# Patient Record
Sex: Female | Born: 1985 | Race: White | Hispanic: No | Marital: Single | Smoking: Current every day smoker
Health system: Southern US, Community
[De-identification: ages and names within clinical notes are randomized; demographics above are authoritative.]

## PROBLEM LIST (undated history)

## (undated) DIAGNOSIS — F32A Depression, unspecified: Secondary | ICD-10-CM

## (undated) DIAGNOSIS — F329 Major depressive disorder, single episode, unspecified: Secondary | ICD-10-CM

## (undated) DIAGNOSIS — R569 Unspecified convulsions: Secondary | ICD-10-CM

## (undated) DIAGNOSIS — F909 Attention-deficit hyperactivity disorder, unspecified type: Secondary | ICD-10-CM

---

## 2009-10-08 ENCOUNTER — Emergency Department: Payer: Self-pay | Admitting: Emergency Medicine

## 2011-04-07 ENCOUNTER — Inpatient Hospital Stay: Payer: Self-pay | Admitting: Obstetrics and Gynecology

## 2011-04-11 LAB — PATHOLOGY REPORT

## 2012-10-31 LAB — URINALYSIS, COMPLETE
Bilirubin,UR: NEGATIVE
Blood: NEGATIVE
Glucose,UR: NEGATIVE mg/dL (ref 0–75)
Ph: 6 (ref 4.5–8.0)
Specific Gravity: 1.012 (ref 1.003–1.030)
Squamous Epithelial: 4

## 2012-10-31 LAB — TSH: Thyroid Stimulating Horm: 2.4 u[IU]/mL

## 2012-10-31 LAB — COMPREHENSIVE METABOLIC PANEL
Albumin: 4.2 g/dL (ref 3.4–5.0)
Anion Gap: 8 (ref 7–16)
Bilirubin,Total: 0.4 mg/dL (ref 0.2–1.0)
Chloride: 111 mmol/L — ABNORMAL HIGH (ref 98–107)
Co2: 23 mmol/L (ref 21–32)
Glucose: 90 mg/dL (ref 65–99)
Osmolality: 281 (ref 275–301)
SGPT (ALT): 30 U/L (ref 12–78)
Sodium: 142 mmol/L (ref 136–145)
Total Protein: 8.3 g/dL — ABNORMAL HIGH (ref 6.4–8.2)

## 2012-10-31 LAB — DRUG SCREEN, URINE
Amphetamines, Ur Screen: POSITIVE (ref ?–1000)
Cannabinoid 50 Ng, Ur ~~LOC~~: NEGATIVE (ref ?–50)
Cocaine Metabolite,Ur ~~LOC~~: NEGATIVE (ref ?–300)

## 2012-10-31 LAB — ETHANOL
Ethanol %: <0.003 % (ref 0.000–0.080)
Ethanol: <3 mg/dL

## 2012-10-31 LAB — CBC
HGB: 15.3 g/dL (ref 12.0–16.0)
MCH: 34 pg (ref 26.0–34.0)
MCV: 99 fL (ref 80–100)
RBC: 4.51 10*6/uL (ref 3.80–5.20)

## 2012-10-31 LAB — SALICYLATE LEVEL: Salicylates, Serum: 5.5 mg/dL — ABNORMAL HIGH

## 2012-10-31 LAB — ACETAMINOPHEN LEVEL: Acetaminophen: <2 ug/mL

## 2012-11-01 ENCOUNTER — Inpatient Hospital Stay: Payer: Self-pay | Admitting: Psychiatry

## 2012-11-02 LAB — BEHAVIORAL MEDICINE 1 PANEL
Alkaline Phosphatase: 46 U/L — ABNORMAL LOW (ref 50–136)
Anion Gap: 5 — ABNORMAL LOW (ref 7–16)
BUN: 10 mg/dL (ref 7–18)
Basophil #: 0 10*3/uL (ref 0.0–0.1)
Bilirubin,Total: 0.3 mg/dL (ref 0.2–1.0)
Creatinine: 0.69 mg/dL (ref 0.60–1.30)
EGFR (African American): >60
EGFR (Non-African Amer.): >60
Eosinophil #: 0.1 10*3/uL (ref 0.0–0.7)
Eosinophil %: 1.5 %
HGB: 12.8 g/dL (ref 12.0–16.0)
Lymphocyte #: 3.7 10*3/uL — ABNORMAL HIGH (ref 1.0–3.6)
MCH: 34 pg (ref 26.0–34.0)
MCHC: 34.9 g/dL (ref 32.0–36.0)
MCV: 97 fL (ref 80–100)
Monocyte %: 7.6 %
Neutrophil #: 4.2 10*3/uL (ref 1.4–6.5)
Neutrophil %: 48.3 %
Osmolality: 283 (ref 275–301)
Platelet: 250 10*3/uL (ref 150–440)
SGOT(AST): 23 U/L (ref 15–37)
Sodium: 143 mmol/L (ref 136–145)
Thyroid Stimulating Horm: 1.85 u[IU]/mL
Total Protein: 6.7 g/dL (ref 6.4–8.2)

## 2013-08-21 ENCOUNTER — Emergency Department: Payer: Self-pay | Admitting: Emergency Medicine

## 2013-08-21 LAB — URINALYSIS, COMPLETE
BILIRUBIN, UR: NEGATIVE
Bacteria: NONE SEEN
Blood: NEGATIVE
Glucose,UR: NEGATIVE mg/dL (ref 0–75)
LEUKOCYTE ESTERASE: NEGATIVE
NITRITE: NEGATIVE
PH: 6 (ref 4.5–8.0)
Protein: NEGATIVE
RBC,UR: 2 /HPF (ref 0–5)
SPECIFIC GRAVITY: 1.021 (ref 1.003–1.030)
WBC UR: 1 /HPF (ref 0–5)

## 2013-08-21 LAB — ACETAMINOPHEN LEVEL: Acetaminophen: <2 ug/mL

## 2013-08-21 LAB — CBC
HCT: 38.4 % (ref 35.0–47.0)
HGB: 12.6 g/dL (ref 12.0–16.0)
MCH: 32.4 pg (ref 26.0–34.0)
MCHC: 32.8 g/dL (ref 32.0–36.0)
MCV: 99 fL (ref 80–100)
Platelet: 393 10*3/uL (ref 150–440)
RBC: 3.89 10*6/uL (ref 3.80–5.20)
RDW: 13.5 % (ref 11.5–14.5)
WBC: 7.6 10*3/uL (ref 3.6–11.0)

## 2013-08-21 LAB — PREGNANCY, URINE: PREGNANCY TEST, URINE: NEGATIVE m[IU]/mL

## 2013-08-21 LAB — BASIC METABOLIC PANEL
ANION GAP: 6 — AB (ref 7–16)
BUN: 8 mg/dL (ref 7–18)
CHLORIDE: 110 mmol/L — AB (ref 98–107)
CREATININE: 0.92 mg/dL (ref 0.60–1.30)
Calcium, Total: 8.6 mg/dL (ref 8.5–10.1)
Co2: 26 mmol/L (ref 21–32)
Glucose: 100 mg/dL — ABNORMAL HIGH (ref 65–99)
OSMOLALITY: 282 (ref 275–301)
POTASSIUM: 3.3 mmol/L — AB (ref 3.5–5.1)
SODIUM: 142 mmol/L (ref 136–145)

## 2013-08-21 LAB — DRUG SCREEN, URINE
AMPHETAMINES, UR SCREEN: POSITIVE (ref ?–1000)
BARBITURATES, UR SCREEN: NEGATIVE (ref ?–200)
Benzodiazepine, Ur Scrn: POSITIVE (ref ?–200)
Cannabinoid 50 Ng, Ur ~~LOC~~: NEGATIVE (ref ?–50)
Cocaine Metabolite,Ur ~~LOC~~: NEGATIVE (ref ?–300)
MDMA (Ecstasy)Ur Screen: NEGATIVE (ref ?–500)
Methadone, Ur Screen: NEGATIVE (ref ?–300)
Opiate, Ur Screen: NEGATIVE (ref ?–300)
Phencyclidine (PCP) Ur S: NEGATIVE (ref ?–25)
Tricyclic, Ur Screen: NEGATIVE (ref ?–1000)

## 2013-08-21 LAB — SALICYLATE LEVEL: SALICYLATES, SERUM: 5.2 mg/dL — AB

## 2013-08-21 LAB — ETHANOL
Ethanol %: <0.003 % (ref 0.000–0.080)
Ethanol: <3 mg/dL

## 2013-08-21 LAB — TSH: Thyroid Stimulating Horm: 1.11 u[IU]/mL

## 2014-09-01 NOTE — Consult Note (Signed)
PATIENT NAME:  Wanda Zavala, Wanda Zavala MR#:  [id number] DATE OF BIRTH:  1985-10-12  DATE OF CONSULTATION:  11/01/2012  CONSULTING PHYSICIAN:  Izola PriceFrances C. Jaclynn MajorGreason, MD  CHIEF COMPLAINT:  "I don't know why I have to be here."  SUMMARY:  Miss Wanda Zavala is a 29 year old female who is brought to the ED after a seizure at home. Dr. Mayford KnifeWilliams placed her under IVC at the Emergency Department. Evidently, Wanda Zavala has been over-taking her Adderall in an effort to stay awake. She reports she is afraid of her mother-in-law, and does not want to sleep with her while she is in the house. She then goes on to say that she really does not think she is afraid of anyone, and she "might to be on" an antidepressant. She was on an antidepressant in the past, but self-discontinued it. She has an 5441-month-old child, and she starts ruminating about postpartum depression. She then goes on to deny that she was suicidal. Then she went on to say that she is safe at home. She sees Dr. Omelia Zavala.   HISTORY OF PRESENT ILLNESS:  Wanda Zavala reports that she takes her medicine as prescribed, with the exception of trying to stay awake. She is currently denying any substance abuse, although her urine drug screen is positive for amphetamines and benzodiazepines.  On interview, Wanda Zavala is somewhat sad-appearing and disheveled. She has soft speech and looks rather blunted. She is alert and attentive. She is vague. Previous hospitalizations, she is also vague about that. History of suicide attempt. She has current outpatient treatment. She currently is taking out patient medications prescribed by Dr. [place] in [place] They are Adderall 5 mg extended release p.o. q. morning, Xanax 1 mg p.o. q.i.d., and she also takes Soma 350 mg p.o. q day.   ALLERGIES:  No known drug allergies.  SOCIAL HISTORY:  She denies any drug history.  PAST MEDICAL/SURGICAL HISTORY:  She denies mental illness in the family.  SOCIAL HISTORY:  She lives at home and has an  7241-month old child. She denies any history of physical or sexual abuse. She denies any history of arrests or previous incarcerations. Speech is soft and slightly hard to understand, as there is a little slurring. Mood is flat and sad. She was groggy or interview. Her affect was slightly depressed. Her thought processes were linear and goal-oriented. She was oriented x 4. Her attention was within normal limits. Her concentration was fair. Her memory was impaired, but that could be because of her grogginess. Fund of knowledge was fair. Language fair. Judgment is poor, with minimal insight. Her reliability was fair. Suicide risk level, I would say some current risk.  REVIEW OF SYSTEMS:  Noncontributory.  MEDICATIONS:  Adderall 5 mg extended release p.o. q. morning, Xanax 1 mg p.o. q.i.d., Soma 350 mg p.o. q. day.   ALLERGIES:  No known drug allergies.  AXIS I:  Depressive disorder NOS. AXIS II:  Nothing that I know of.  TREATMENT PLAN:  Ad[facility]. Medications, do not prescribe her Xanax.       ____________________________ Izola PriceFrances C. Jaclynn MajorGreason, MD fcg:mr D: 11/01/2012 16:55:00 ET T: 11/01/2012 20:22:53 ET JOB#: [id number]  cc: Izola PriceFrances C. Jaclynn MajorGreason, MD, <Dictator> Maryan PulsFRANCES C Yi Haugan MD ELECTRONICALLY SIGNED 11/11/2012 12:09

## 2014-09-01 NOTE — Discharge Summary (Signed)
PATIENT NAME:  Wanda RichardMARTIN, Ahlia MR#:  [id number] DATE OF BIRTH:  06/27/1985  DATE OF ADMISSION:  11/01/2012 DATE OF DISCHARGE:  11/03/2012  HOSPITAL COURSE: See dictated history and physical for details of admission. This 29 year old woman presented to the Emergency Room having had a seizure. Apparently, there was concern that this was somehow related to her prescription medication which appears to have been the justification for hospitalization on the psychiatry unit. On the unit, the patient has denied suicidal ideation, and has not expressed symptoms of depression, and has not shown any psychosis or dangerous behavior. She was continued on benzodiazepines, ultimately at the same dose that she was taking outside the hospital. I have attempted to get a neurology consult without success as we appear to be without neurologic services at this time. It is possible that her seizure may have been related to a recent decrease in her use of alprazolam, possibly by running short of it. The patient has been educated about the proper use of benzodiazepines, particularly not overusing it, not making major shifts from high doses to low doses. She has been educated about the importance of following up with her primary care doctor about her potential seizure disorder. She is encouraged to avoid all substance abuse. There does not appear to be clear indication for initiation of other medication at this point. No indication of acute dangerousness. The patient is to be discharged home and will follow up with Dr. Omelia BlackwaterHeaden in the community.   DISCHARGE MEDICATIONS: Alprazolam 1 mg q. 6 hours, 10 days given.   LABORATORY RESULTS: Admission labs from the 22nd showed a drug screen positive for amphetamines and benzodiazepines, which is consistent with her prescribed medicine. TSH normal at 2.4. Alcohol undetected. Total protein slightly elevated at 8.3, chloride slightly elevated 111, no significance. Hematology panel all normal.  Liver function tests minimal abnormalities. No significance.   MENTAL STATUS EXAM AT DISCHARGE: Neatly dressed and groomed woman, looks her stated age, cooperative with the interview. Good eye contact, normal psychomotor activity. Speech normal in rate, tone and volume. Affect is euthymic, reactive, appropriate. Mood is stated as good. Thoughts appear lucid with no evidence of loosening of associations or delusions. Denies auditory or visual hallucinations. Denies suicidal or homicidal ideation. Shows improved judgment and insight. Normal intelligence.   DISPOSITION: Discharge home. Follow up with her usual outpatient provider.  DIAGNOSIS, PRINCIPAL AND PRIMARY:  AXIS I: Panic disorder.   SECONDARY DIAGNOSES: AXIS I: No diagnosis.   AXIS II: No diagnosis.   AXIS III: New onset seizure.   AXIS IV: Severe from taking care of 3 young children, recent move into difficult circumstances, financial problems, lack of access to care.   AXIS V: Functioning at time of evaluation and discharge 60.   ____________________________ Audery AmelJohn T. Clapacs, MD jtc:cb D: 11/03/2012 17:35:45 ET T: 11/03/2012 22:38:49 ET JOB#: [id number]  cc: Audery AmelJohn T. Clapacs, MD, <Dictator> Audery AmelJOHN T CLAPACS MD ELECTRONICALLY SIGNED 11/04/2012 16:06

## 2014-09-01 NOTE — H&P (Signed)
PATIENT NAME:  Wanda Zavala, Wanda Zavala MR#:  [id number] DATE OF BIRTH:  03/22/1986  DATE OF ADMISSION:  11/01/2012  DATE OF ASSESSMENT:  11/02/2012  IDENTIFYING INFORMATION AND CHIEF COMPLAINT: A 29 year old woman who came to the Emergency Room because of a new onset seizure.   CHIEF COMPLAINT: "I had a seizure."   HISTORY OF PRESENT ILLNESS: Information obtained from the patient and the chart. She says that yesterday she was going about her business taking care of her children when she started to feel a little bit funny. The next thing she remembers was being escorted into an ambulance. She reports that family witnessed that she fell down on the ground and was flopping around with her head banging against floor. She also reports that she bit her lip badly and had a lot of blood on her mouth. The patient had not ever had a seizure before that she knows of. She does not report any acute mood symptoms. Denies depression. Denies suicidal or homicidal ideation. She does give a history of panic attacks and also says that she has been feeling like she is under a lot of stress recently. Sleep recently had been poor for the last few days. The patient denies that she had been trying to stay awake excessively. She says that she has been taking her prescription medicines just as prescribed, except that she has probably been taking less Xanax than she normally does. He has been taking maybe 1 or 2 more or at times even know Xanax a day as opposed to the 4 a day that she is prescribed. She has not been abusing her Adderall. Denies any other substance abuse. Denies drinking.   PAST PSYCHIATRIC HISTORY: No previous hospitalizations. No history of suicide attempts. No history of violence. Does have a history of substance abuse when she was in her teens but says that she has not abused drugs in almost 10 years. She has a history of panic attacks which she describes as being pounding heart, chest pain and anxiety. They have been  going on for a few years and occur somewhere between 2 and 3 times a week. She sees Dr. Omelia Blackwater for them. She has been prescribed Xanax and takes 1 mg 4 times a day, although it sounds like she mostly uses it as needed. She says she has never been on any other medication for panic attacks or any antidepressants. She has been given Adderall in modest doses by Dr. Omelia Blackwater as well for possible treatment of ADHD.  She is currently taking 5 mg 1 or 2 a day. She says that he had tried her at one point on the extended release form, but that it kept her up too late at night so she switched back to the immediate release form.   PAST MEDICAL HISTORY: Denies any prior history of seizures. Denies heart disease, thyroid disease, strokes, any kind of neurologic problem.   MEDICATIONS: 1.  Xanax 1 mg 4 times a day as needed. 2.  Adderall 5 mg 1 or 2 a day.   ALLERGIES: No known drug allergies.   SOCIAL HISTORY: The patient has 3 young children, ages 53 months, 4 years and 6 years. The oldest one has Hirschsprung's disease congenitally which requires a fair bit of daily medical attention. Two of her children have also been diagnosed with ADHD. The patient is not currently working. She lives with her boyfriend who is the father of the youngest child. She also lives with her boyfriend's mother and stepfather.  They recently moved in with those people and she finds it a stressful situation. She does not get along very well with the "mother-in-law."   FAMILY HISTORY: Has a sister who has seizures. No other family history of mental illness identified.   REVIEW OF SYSTEMS:  Currently says she is feeling a little bit tired. Also having a heavy menstrual period, which is uncomfortable. She denies depression, denies suicidal or homicidal ideation, denies psychotic symptoms, denies any other physical symptoms right now.   MENTAL STATUS EXAMINATION: Casually dressed, reasonably groomed woman who looks her stated age, cooperative  with the interview. Eye contact good. Psychomotor activity normal. Speech normal in rate, tone and volume. Affect slightly dysphoric, but reactive mood, stated as being okay. Thoughts are lucid without loosening of associations. No sign of delusions or loosening of associations. Denies hallucinations. Denies suicidal or homicidal ideation. Intelligence appears to be normal. Judgment and insight adequate. Alert and oriented x 4.   PHYSICAL EXAMINATION:  GENERAL: A woman who appears to be in no acute distress. She does have an area on her lower lip that has been bitten consistent with having had a seizure. No other skin lesions.  HEENT: Pupils equal and reactive. Face symmetric.  NECK AND BACK: Nontender.  MUSCULOSKELETAL: Full range of motion at all extremities. Normal gait. Strength and reflexes normal and symmetric throughout. Cranial nerves symmetric and normal.  LUNGS: Clear to auscultation.  HEART: Regular rate and rhythm.  ABDOMEN: Soft, nontender, normal bowel sounds.  VITAL SIGNS:  Pulse currently 75, respirations 18, blood pressure 111/69, temperature 98.   LABORATORY RESULTS: Drug screen was positive for benzodiazepines and amphetamines, which is consistent with what she is prescribed. TSH normal at 2.4. Alcohol level undetected. Chemistries unremarkable. CBC unremarkable. Urinalysis positive for probable urinary tract infection. Salicylate is slightly high at 5.5, but not toxic. Acetaminophen not detected.   ASSESSMENT: This is a 29 year old woman who came to the Emergency Room for a first seizure. It is not clear to me why this was thought to be a psychiatric case. She does have a history of anxiety symptoms and is being treated by an outpatient doctor with benzodiazepines. She to me denies having any history of abuse, misuse or problematic behavior with her medications. If anything she says she may have cut down a bit on the Xanax recently. It is possible that that could have contributed to  the seizure. Right now she is not acutely dangerous to herself or others. Calm, not psychotic.   TREATMENT PLAN: I am going to ask for a neurology consult. Also get a head CT. Her urinary tract infection will be treated empirically. I am going to put her on 2 mg total of Xanax a day, standing dose, leave off the Adderall. We will try and get some collateral information if possible, but she probably does not need to be in the hospital more than another day.   DIAGNOSIS, PRINCIPAL AND PRIMARY:  AXIS I:  Panic disorder.  SECONDARY DIAGNOSES: AXIS I: Adjustment disorder with anxiety, attention deficit/hyperactivity disorder by history.  AXIS II: No diagnosis. AXIS III:  New onset seizure.  AXIS IV: Moderate stress from social difficulties with her family.  AXIS V: Functioning at time of evaluation 55. ____________________________ Audery AmelJohn T. Clapacs, MD jtc:sb D: 11/02/2012 12:01:48 ET T: 11/02/2012 12:18:44 ET JOB#: [id number]  cc: Audery AmelJohn T. Clapacs, MD, <Dictator> Audery AmelJOHN T CLAPACS MD ELECTRONICALLY SIGNED 11/02/2012 18:49

## 2014-09-02 NOTE — Consult Note (Signed)
Brief Consult Note: Diagnosis: Schizoaffective d/i bipolar type, ADHD, PTSD, polysubstance dependence.   Patient was seen by consultant.   Consult note dictated.   Recommend further assessment or treatment.   Orders entered.   Comments: [name] has a h/o psychosis and mood instability here after seizure episode?  PLAN: 1. She is on IVC.  2. Will continue Prozac/Zyprexa combination.  3. Will hold stimulants and ativan.  4. I will follow along.  Electronic Signatures: Kristine LineaPucilowska, Tomothy Eddins (MD)  (Signed 13-Apr-15 18:46)  Authored: Brief Consult Note   Last Updated: 13-Apr-15 18:46 by Kristine LineaPucilowska, Tisheena Maguire (MD)
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
## 2014-09-02 NOTE — Consult Note (Signed)
Brief Consult Note: Diagnosis: Schizoaffective d/o bipolar type, ADHD, PTSD, polysubstance dependence.   Patient was seen by consultant.   Orders entered.   Comments: Pt seen for follow up. she was sitting in bed and able to respond to most of the questions. she stated that sh came to the hospital as she ha a seizure a home but noe she is feeling better. She is willing to return to her family. [facility] her family and her father is willing to take her back to [place]. She was living with her boyfriend before coming to ED. She follows with Dr Omelia BlackwaterHeaden on a regular basis. She is complaint with meds. Denied SI/HI or plans.   Plan; Pt will be discharged and will d/c IVC as she does not meet the criteria for IVC.  She will follow up with Dr Omelia BlackwaterHeaden after d/c.  Prescription  given for Symbyax..  Electronic Signatures: Rhunette CroftFaheem, Kentavious Michele S (MD)  (Signed 14-Apr-15 10:37)  Authored: Brief Consult Note   Last Updated: 14-Apr-15 10:37 by Rhunette CroftFaheem, Miasia Crabtree S (MD)

## 2014-12-11 ENCOUNTER — Encounter: Payer: Self-pay | Admitting: Emergency Medicine

## 2014-12-11 ENCOUNTER — Emergency Department: Payer: Medicaid Other

## 2014-12-11 ENCOUNTER — Emergency Department
Admission: EM | Admit: 2014-12-11 | Discharge: 2014-12-11 | Disposition: A | Discharge status: Home / Self Care | Payer: Medicaid Other | Attending: Student | Admitting: Student

## 2014-12-11 DIAGNOSIS — X58XXXA Exposure to other specified factors, initial encounter: Secondary | ICD-10-CM | POA: Insufficient documentation

## 2014-12-11 DIAGNOSIS — S99911A Unspecified injury of right ankle, initial encounter: Secondary | ICD-10-CM | POA: Diagnosis present

## 2014-12-11 DIAGNOSIS — Y9389 Activity, other specified: Secondary | ICD-10-CM | POA: Diagnosis not present

## 2014-12-11 DIAGNOSIS — S93401A Sprain of unspecified ligament of right ankle, initial encounter: Secondary | ICD-10-CM | POA: Diagnosis not present

## 2014-12-11 DIAGNOSIS — Z720 Tobacco use: Secondary | ICD-10-CM | POA: Diagnosis not present

## 2014-12-11 DIAGNOSIS — Y998 Other external cause status: Secondary | ICD-10-CM | POA: Diagnosis not present

## 2014-12-11 DIAGNOSIS — Y9289 Other specified places as the place of occurrence of the external cause: Secondary | ICD-10-CM | POA: Insufficient documentation

## 2014-12-11 DIAGNOSIS — F329 Major depressive disorder, single episode, unspecified: Secondary | ICD-10-CM | POA: Insufficient documentation

## 2014-12-11 HISTORY — DX: Major depressive disorder, single episode, unspecified: F32.9

## 2014-12-11 HISTORY — DX: Depression, unspecified: F32.A

## 2014-12-11 MED ORDER — NAPROXEN SODIUM 275 MG PO TABS: 275.0000 mg | ORAL_TABLET | Freq: Two times a day (BID) | ORAL | Status: AC

## 2014-12-11 NOTE — ED Provider Notes (Signed)
[facility] Emergency Department Provider Note  ____________________________________________  Time seen: Approximately 10:54 AM  I have reviewed the triage vital signs and the nursing notes.   HISTORY  Chief Complaint Ankle Pain    HPI Wanda Zavala is a 29 y.o. female complaining of right ankle pain status post prolonged standing. Patient has a history of medial ankle fracture 3 years ago. 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 Patient rating the pain as a 10 over 10.   Past Medical History  Diagnosis Date  . Depression     There are no active problems to display for this patient.   History reviewed. No pertinent past surgical history.  Current Outpatient Rx  Name  Route  Sig  Dispense  Refill  . alprazolam (XANAX) 2 MG tablet   Oral   Take 2 mg by mouth at bedtime as needed for sleep.         Marland Kitchen doxepin (SINEQUAN) 50 MG capsule   Oral   Take 50 mg by mouth at bedtime.           Allergies Review of patient's allergies indicates no known allergies.  No family history on file.  Social History History  Substance Use Topics  . Smoking status: Current Every Day Smoker  . Smokeless tobacco: Not on file  . Alcohol Use: No    Review of Systems Constitutional: No fever/chills Eyes: No visual changes. ENT: No sore throat. Cardiovascular: Denies chest pain. Respiratory: Denies shortness of breath. Gastrointestinal: No abdominal pain.  No nausea, no vomiting.  No diarrhea.  No constipation. Genitourinary: Negative for dysuria. Musculoskeletal: Right ankle pain Skin: Negative for rash. Neurological: Negative for headaches, focal weakness or numbness. Psychiatric:Depression 10-point ROS otherwise negative.  ____________________________________________   PHYSICAL EXAM:  VITAL SIGNS: ED Triage Vitals  Enc Vitals Group     BP --      Pulse --      Resp --      Temp --      Temp src --    SpO2 --      Weight --      Height --      Head Cir --      Peak Flow --      Pain Score --      Pain Loc --      Pain Edu? --      Excl. in GC? --     Constitutional: Alert and oriented. Well appearing and in no acute distress. Eyes: Conjunctivae are normal. PERRL. EOMI. Head: Atraumatic. Nose: No congestion/rhinnorhea. Mouth/Throat: Mucous membranes are moist.  Oropharynx non-erythematous. Neck: No stridor.  No cervical spine tenderness to palpation. Hematological/Lymphatic/Immunilogical: No cervical lymphadenopathy. Cardiovascular: Normal rate, regular rhythm. Grossly normal heart sounds.  Good peripheral circulation. Respiratory: Normal respiratory effort.  No retractions. Lungs CTAB. Gastrointestinal: Soft and nontender. No distention. No abdominal bruits. No CVA tenderness. Musculoskeletal: No deformity to the right lmediall ankle with obvious edema. Neurovascular intact free nuchal range of motion.  Neurologic:  Normal speech and language. No gross focal neurologic deficits are appreciated. No gait instability. Skin:  Skin is warm, dry and intact. No rash noted. Psychiatric: Mood and affect are normal. Speech and behavior are normal.  ____________________________________________   LABS (all labs ordered are listed, but only abnormal results are displayed)  Labs Reviewed - No data to display ____________________________________________  EKG   ____________________________________________  RADIOLOGY  No acute findings on x-ray.  I, Joni Reining, personally viewed and evaluated these images as part of my medical decision making.   ____________________________________________   PROCEDURES  Procedure(s) performed: None  Critical Care performed: No  ____________________________________________   INITIAL IMPRESSION / ASSESSMENT AND PLAN / ED COURSE  Pertinent labs & imaging results that were available during my care of the patient were reviewed by me and  considered in my medical decision making (see chart for details). Right ankle sprain ____________________________________________   FINAL CLINICAL IMPRESSION(S) / ED DIAGNOSES  Final diagnoses:  Right ankle sprain, initial encounter      Joni Reining, PA-C 12/11/14 1147  Gayla Doss, MD 12/11/14 [phone]

## 2014-12-11 NOTE — ED Notes (Signed)
Having pain to right ankle w/o injury . States she had broken that ankle several years ago. But has been standing on her feet 10 hr a day

## 2014-12-11 NOTE — Discharge Instructions (Signed)
Advised elastic ankle support

## 2016-06-07 ENCOUNTER — Encounter: Payer: Self-pay | Admitting: Emergency Medicine

## 2016-06-07 ENCOUNTER — Emergency Department: Payer: Medicaid Other

## 2016-06-07 ENCOUNTER — Emergency Department
Admission: EM | Admit: 2016-06-07 | Discharge: 2016-06-07 | Disposition: A | Discharge status: Home / Self Care | Payer: Medicaid Other | Attending: Emergency Medicine | Admitting: Emergency Medicine

## 2016-06-07 DIAGNOSIS — Y999 Unspecified external cause status: Secondary | ICD-10-CM | POA: Diagnosis not present

## 2016-06-07 DIAGNOSIS — S01511A Laceration without foreign body of lip, initial encounter: Secondary | ICD-10-CM | POA: Insufficient documentation

## 2016-06-07 DIAGNOSIS — Y939 Activity, unspecified: Secondary | ICD-10-CM | POA: Diagnosis not present

## 2016-06-07 DIAGNOSIS — Y92009 Unspecified place in unspecified non-institutional (private) residence as the place of occurrence of the external cause: Secondary | ICD-10-CM | POA: Diagnosis not present

## 2016-06-07 DIAGNOSIS — F909 Attention-deficit hyperactivity disorder, unspecified type: Secondary | ICD-10-CM | POA: Insufficient documentation

## 2016-06-07 DIAGNOSIS — S0990XA Unspecified injury of head, initial encounter: Secondary | ICD-10-CM | POA: Diagnosis present

## 2016-06-07 DIAGNOSIS — F17200 Nicotine dependence, unspecified, uncomplicated: Secondary | ICD-10-CM | POA: Insufficient documentation

## 2016-06-07 DIAGNOSIS — Z79899 Other long term (current) drug therapy: Secondary | ICD-10-CM | POA: Diagnosis not present

## 2016-06-07 DIAGNOSIS — T148XXA Other injury of unspecified body region, initial encounter: Secondary | ICD-10-CM

## 2016-06-07 HISTORY — DX: Unspecified convulsions: R56.9

## 2016-06-07 HISTORY — DX: Attention-deficit hyperactivity disorder, unspecified type: F90.9

## 2016-06-07 MED ORDER — ACETAMINOPHEN 500 MG PO TABS
ORAL_TABLET | ORAL | Status: AC
  Filled 2016-06-07: qty 2

## 2016-06-07 MED ORDER — ACETAMINOPHEN 500 MG PO TABS
1000.0000 mg | ORAL_TABLET | Freq: Once | ORAL | Status: AC
  Administered 2016-06-07: 1000 mg via ORAL

## 2016-06-07 NOTE — Discharge Instructions (Signed)
Please seek medical attention for any high fevers, chest pain, shortness of breath, change in behavior, persistent vomiting, bloody stool or any other new or concerning symptoms.  

## 2016-06-07 NOTE — ED Notes (Signed)
[employer] at bedside.

## 2016-06-07 NOTE — ED Notes (Signed)
Pt provided with ice pack for face, pt has declined to use ice pack. Pt assisted up to restroom, pt "forgot" to provided rn with sample when asked. Pt informed will need sample with next void.
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
## 2016-06-07 NOTE — ED Notes (Signed)
Pt with left sided periorbital ecchymosis extending to upper left cheek. Pt with dried blood noted on left eyelashes. Pt's left eye manually opened with 2mm round briskly reactive pupil. Bilateral sclera noted. Pt with small amount of blood noted to left lower left quadrant of sclera. No drainage noted from ears. Left sided dried epistaxis noted. Pt with 2cm linear laceration noted to left mid upper inner lip.

## 2016-06-07 NOTE — ED Notes (Signed)
Patient transported to CT 
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
## 2016-06-24 ENCOUNTER — Emergency Department
Admission: EM | Admit: 2016-06-24 | Discharge: 2016-06-24 | Disposition: A | Discharge status: Home / Self Care | Payer: Medicaid Other | Attending: Emergency Medicine | Admitting: Emergency Medicine

## 2016-06-24 DIAGNOSIS — F17200 Nicotine dependence, unspecified, uncomplicated: Secondary | ICD-10-CM | POA: Insufficient documentation

## 2016-06-24 DIAGNOSIS — G40909 Epilepsy, unspecified, not intractable, without status epilepticus: Secondary | ICD-10-CM | POA: Insufficient documentation

## 2016-06-24 DIAGNOSIS — R8299 Other abnormal findings in urine: Secondary | ICD-10-CM | POA: Insufficient documentation

## 2016-06-24 DIAGNOSIS — F909 Attention-deficit hyperactivity disorder, unspecified type: Secondary | ICD-10-CM | POA: Insufficient documentation

## 2016-06-24 DIAGNOSIS — R569 Unspecified convulsions: Secondary | ICD-10-CM

## 2016-06-24 LAB — URINALYSIS, COMPLETE (UACMP) WITH MICROSCOPIC
Bacteria, UA: NONE SEEN
Bilirubin Urine: NEGATIVE
Glucose, UA: NEGATIVE mg/dL
HGB URINE DIPSTICK: NEGATIVE
Ketones, ur: 20 mg/dL — AB
NITRITE: NEGATIVE
PROTEIN: 100 mg/dL — AB
Specific Gravity, Urine: 1.025 (ref 1.005–1.030)
pH: 5 (ref 5.0–8.0)

## 2016-06-24 LAB — CBC WITH DIFFERENTIAL/PLATELET
BASOS ABS: 0 10*3/uL (ref 0–0.1)
Basophils Relative: 0 %
Eosinophils Absolute: 0 10*3/uL (ref 0–0.7)
Eosinophils Relative: 0 %
HCT: 44.7 % (ref 35.0–47.0)
HEMOGLOBIN: 15.8 g/dL (ref 12.0–16.0)
LYMPHS PCT: 10 %
Lymphs Abs: 1 10*3/uL (ref 1.0–3.6)
MCH: 35.9 pg — ABNORMAL HIGH (ref 26.0–34.0)
MCHC: 35.3 g/dL (ref 32.0–36.0)
MCV: 101.7 fL — ABNORMAL HIGH (ref 80.0–100.0)
Monocytes Absolute: 0.4 10*3/uL (ref 0.2–0.9)
Monocytes Relative: 5 %
NEUTROS PCT: 85 %
Neutro Abs: 8.5 10*3/uL — ABNORMAL HIGH (ref 1.4–6.5)
PLATELETS: 273 10*3/uL (ref 150–440)
RBC: 4.39 MIL/uL (ref 3.80–5.20)
RDW: 13.8 % (ref 11.5–14.5)
WBC: 10 10*3/uL (ref 3.6–11.0)

## 2016-06-24 LAB — COMPREHENSIVE METABOLIC PANEL
ALK PHOS: 49 U/L (ref 38–126)
ALT: 61 U/L — AB (ref 14–54)
AST: 62 U/L — AB (ref 15–41)
Albumin: 4.2 g/dL (ref 3.5–5.0)
Anion gap: 11 (ref 5–15)
BUN: 11 mg/dL (ref 6–20)
CO2: 19 mmol/L — AB (ref 22–32)
CREATININE: 0.85 mg/dL (ref 0.44–1.00)
Calcium: 9.2 mg/dL (ref 8.9–10.3)
Chloride: 106 mmol/L (ref 101–111)
GFR calc Af Amer: >60 mL/min (ref >60)
GFR calc non Af Amer: >60 mL/min (ref >60)
Glucose, Bld: 152 mg/dL — ABNORMAL HIGH (ref 65–99)
Potassium: 4.4 mmol/L (ref 3.5–5.1)
SODIUM: 136 mmol/L (ref 135–145)
Total Bilirubin: 1.1 mg/dL (ref 0.3–1.2)
Total Protein: 8 g/dL (ref 6.5–8.1)

## 2016-06-24 LAB — URINE DRUG SCREEN, QUALITATIVE (ARMC ONLY)
Amphetamines, Ur Screen: NOT DETECTED
BENZODIAZEPINE, UR SCRN: POSITIVE — AB
Barbiturates, Ur Screen: NOT DETECTED
Cannabinoid 50 Ng, Ur ~~LOC~~: NOT DETECTED
Cocaine Metabolite,Ur ~~LOC~~: NOT DETECTED
MDMA (ECSTASY) UR SCREEN: NOT DETECTED
METHADONE SCREEN, URINE: NOT DETECTED
Opiate, Ur Screen: NOT DETECTED
Phencyclidine (PCP) Ur S: NOT DETECTED
Tricyclic, Ur Screen: NOT DETECTED

## 2016-06-24 LAB — POCT PREGNANCY, URINE: PREG TEST UR: NEGATIVE

## 2016-06-24 LAB — ETHANOL

## 2016-06-24 MED ORDER — LORAZEPAM 2 MG/ML IJ SOLN
1.0000 mg | Freq: Once | INTRAMUSCULAR | Status: AC
  Administered 2016-06-24: 1 mg via INTRAVENOUS
  Filled 2016-06-24: qty 1

## 2016-06-24 MED ORDER — ALPRAZOLAM 2 MG PO TABS: 1.0000 mg | ORAL_TABLET | Freq: Two times a day (BID) | ORAL | 0 refills | Status: DC
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
## 2016-06-24 NOTE — ED Notes (Signed)
Pt discharged home after verbalizing understanding of discharge instructions; nad noted. 

## 2016-06-24 NOTE — Discharge Instructions (Addendum)
While we cannot verify this, does appear by your  history that you  may have had another seizure. For this reason, you are  instructed not to drive, soak in the tub, call and ladders, or do anything else which,  if interrupted by a seizure, could cause harm to you or anyone else. Continue taking Xanax as prescribed and follow closely in the next day or 2 with neurology. I have provided  you with 2 different neurologists to follow up with. If you have any new or worrisome symptoms return to the emergency room
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
## 2016-06-24 NOTE — ED Notes (Signed)
Pt assisted to the bathroom w/o incident. 

## 2016-06-26 LAB — URINE CULTURE
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
## 2018-05-21 ENCOUNTER — Emergency Department: Payer: Medicaid Other

## 2018-05-21 ENCOUNTER — Emergency Department
Admission: EM | Admit: 2018-05-21 | Discharge: 2018-05-22 | Disposition: A | Discharge status: Home / Self Care | Payer: Medicaid Other | Attending: Pediatrics | Admitting: Pediatrics

## 2018-05-21 ENCOUNTER — Other Ambulatory Visit: Payer: Self-pay

## 2018-05-21 ENCOUNTER — Encounter: Payer: Self-pay | Admitting: Emergency Medicine

## 2018-05-21 DIAGNOSIS — F17210 Nicotine dependence, cigarettes, uncomplicated: Secondary | ICD-10-CM | POA: Insufficient documentation

## 2018-05-21 DIAGNOSIS — T401X1A Poisoning by heroin, accidental (unintentional), initial encounter: Secondary | ICD-10-CM | POA: Diagnosis not present

## 2018-05-21 DIAGNOSIS — M542 Cervicalgia: Secondary | ICD-10-CM | POA: Insufficient documentation

## 2018-05-21 DIAGNOSIS — R569 Unspecified convulsions: Secondary | ICD-10-CM | POA: Insufficient documentation

## 2018-05-21 LAB — I-STAT BETA HCG BLOOD, ED (NOT ORDERABLE): I-stat hCG, quantitative: <5 m[IU]/mL (ref <5)
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
## 2018-05-21 NOTE — ED Provider Notes (Signed)
[facility] Emergency Department Provider Note   ____________________________________________   I have reviewed the triage vital signs and the nursing notes.   HISTORY  Chief Complaint Drug Overdose   History limited by: Altered Mental Status   HPI Wanda Zavala is a 33 y.o. female who presents to the emergency department today after apparent drug overdose.  Apparently the patient was at her house when the family heard a sound.  They found her crumpled in a closet.  She was unresponsive and family initiated CPR.  When EMS arrived on scene the patient was responsive.  She does state that she did some drugs today.  She thinks it might of been heroin but is unclear exactly what it was.  She is complaining of some neck pain.    Per medical record review patient has a history of depression, seizures.   Past Medical History:  Diagnosis Date  . ADHD   . Depression   . Seizures (HCC)     There are no active problems to display for this patient.   History reviewed. No pertinent surgical history.  Prior to Admission medications   Medication Sig Start Date End Date Taking? Authorizing Provider  alprazolam Prudy Feeler) 2 MG tablet Take 0.5 tablets (1 mg total) by mouth 2 (two) times daily. 06/24/16   Jeanmarie Plant, MD  doxepin (SINEQUAN) 50 MG capsule Take 50 mg by mouth at bedtime.    [provider]  ibuprofen (ADVIL,MOTRIN) 200 MG tablet Take 200 mg by mouth every 6 (six) hours as needed.    [provider]    Allergies Patient has no known allergies.  History reviewed. No pertinent family history.  Social History Social History   Tobacco Use  . Smoking status: Current Every Day Smoker  . Smokeless tobacco: Never Used  Substance Use Topics  . Alcohol use: Yes  . Drug use: No    Review of Systems Constitutional: No fever/chills Eyes: No visual changes. ENT: Positive for neck pain. Cardiovascular: Denies chest  pain. Respiratory: Denies shortness of breath. Gastrointestinal: No abdominal pain.  No nausea, no vomiting.  No diarrhea.   Genitourinary: Negative for dysuria. Musculoskeletal: Negative for back pain. Skin: Negative for rash. Neurological: Negative for headaches, focal weakness or numbness.  ____________________________________________   PHYSICAL EXAM:  VITAL SIGNS: ED Triage Vitals  Enc Vitals Group     BP 05/21/18 2113 (!) 138/102     Pulse Rate 05/21/18 2113 (!) 130     Resp 05/21/18 2113 20     Temp 05/21/18 2113 97.8 F (36.6 C)     Temp Source 05/21/18 2113 Oral     SpO2 05/21/18 2113 100 %     Weight 05/21/18 2115 136 lb (61.7 kg)     Height 05/21/18 2115 5\' 3"  (1.6 m)     Head Circumference --      Peak Flow --      Pain Score 05/21/18 2115 9   Constitutional: Awake and alert. Tearful  Eyes: Conjunctivae are normal.  ENT      Head: Normocephalic and atraumatic.      Nose: No congestion/rhinnorhea.      Mouth/Throat: Mucous membranes are moist.      Neck: No stridor. Hematological/Lymphatic/Immunilogical: No cervical lymphadenopathy. Cardiovascular: Normal rate, regular rhythm.  No murmurs, rubs, or gallops. Respiratory: Normal respiratory effort without tachypnea nor retractions. Breath sounds are clear and equal bilaterally. No wheezes/rales/rhonchi. Gastrointestinal: Soft and non tender. No rebound. No guarding.  Genitourinary:  Deferred Musculoskeletal: Normal range of motion in all extremities. No lower extremity edema. Neurologic:  Awake and alert. Slightly mumbling. Tearful. No gross focal neurologic deficits are appreciated.  Skin:  Skin is warm, dry and intact. No rash noted.  ____________________________________________    LABS (pertinent positives/negatives)  Beta hcg <5.0   ____________________________________________   EKG  Lurline Idol, attending physician, personally viewed and interpreted this EKG  EKG Time: 2114 Rate:  109 Rhythm: sinus tachycardia Axis: normal Intervals: qtc 470 QRS: narrow, q waves v1 ST changes: no st elevation Impression: abnormal ekg   ____________________________________________    RADIOLOGY  CT head/cervical spine No acute findings  ____________________________________________   PROCEDURES  Procedures  ____________________________________________   INITIAL IMPRESSION / ASSESSMENT AND PLAN / ED COURSE  Pertinent labs & imaging results that were available during my care of the patient were reviewed by me and considered in my medical decision making (see chart for details).   Patient presented to the emergency department today after being found unresponsive by family.  Patient does admit to doing heroin.  She was complaining of some neck pain.  Did get CT head and neck.  These were both negative for acute findings.  On reexam of the patient she started becoming more awake and alert.  I did ask if there is any thoughts of self-harm or depression recently with the patient denying.  She states she was not trying to hurt her self when she did the drugs.  Will continue to observe for further sobriety.  ____________________________________________   FINAL CLINICAL IMPRESSION(S) / ED DIAGNOSES  Final diagnoses:  Accidental overdose of heroin, initial encounter [facility])     Note: This dictation was prepared with Dragon dictation. Any transcriptional errors that result from this process are unintentional     Phineas Semen, MD 05/21/18 2334

## 2018-05-21 NOTE — Discharge Instructions (Signed)
Please seek medical attention and help for any thoughts about wanting to harm yourself, harm others, any concerning change in behavior, severe depression, inappropriate drug use or any other new or concerning symptoms. ° °

## 2018-05-22 NOTE — ED Notes (Signed)
Called to room by patient who is requesting to go home.  Pt has removed all monitors, is out of bed and ambulating in room.  Informed pt that she is waiting to talk with TTS.  Pt states she has a Veterinary surgeoncounselor and denies SI/HI, requesting to go home "I have kids to take care of."  Dr. Alphonzo LemmingsMcShane notified of same.

## 2018-05-22 NOTE — ED Notes (Signed)
Report received, care of pt assumed.  Pt resting on ER stretcher with eyes closed, resp even and nonlabored.  Monitors in placed, VSS, NAD noted, will monitor.

## 2018-05-22 NOTE — BH Assessment (Signed)
Assessment Note  Wanda Zavala is a 33 year old female who presented to the ED via EMS following a call from her family that she was found unresponsive in the closet and they started CPR.  What appeared to be a heroin baggy was found on the scene and track marks were noted on the patient.    Upon assessment, the patient was not cooperative. When asked about the reason for her hospital visit she stated "I was not myself and it won't happen again." She was unwilling to clarify what she meant by not being herself and what would not happen again.   She answered some questions but not others and expressed that she was not interested in treatment.  She denied SI, HI, AVH, and self-injury.  She reports getting in-home services with [facility] but is uncertain if that is Act/CST/IIH for her and her family.  She reported being in [facility] treatment since childhood and denies previous treatment for substance use and is not interested in treatment stating "I don't' believe I have a problem." However, patient reports being drug tested by [employer].  She denies alcohol use and other substance use.  She admits to use on this occasion reporting that this incidence is her first use of opioids and she won't make this mistake again.   Diagnosis: Drug Overdose   Past Medical History:  Past Medical History:  Diagnosis Date  . ADHD   . Depression   . Seizures (HCC)     History reviewed. No pertinent surgical history.  Family History: History reviewed. No pertinent family history.  Social History:  reports that she has been smoking. She has never used smokeless tobacco. She reports current alcohol use. She reports that she does not use drugs.  Additional Social History:  Alcohol / Drug Use Pain Medications: See PTA Prescriptions: See PTA Over the Counter: See PTA History of alcohol / drug use?: Yes Longest period of sobriety (when/how long): not asked Withdrawal Symptoms: Agitation,  Irritability Substance #1 Name of Substance 1: Opioids 1 - Age of First Use: 32 1 - Amount (size/oz): refused 1 - Frequency: refused 1 - Duration: refused 1 - Last Use / Amount: TODAY; unknown amount  CIWA: CIWA-Ar BP: (!) 126/95 Pulse Rate: (!) 106 COWS:    Allergies: No Known Allergies  Home Medications: (Not in a hospital admission)   OB/GYN Status:  Patient's last menstrual period was 05/14/2018 (approximate).  General Assessment Data Assessment unable to be completed: Yes TTS Assessment: In system Is this a Tele or Face-to-Face Assessment?: Face-to-Face Is this an Initial Assessment or a Re-assessment for this encounter?: Initial Assessment Patient Accompanied by:: N/A Language Other than English: No Living Arrangements: Other (Comment) What gender do you identify as?: Female Pregnancy Status: Unknown Living Arrangements: Other relatives Can pt return to current living arrangement?: Yes Admission Status: Voluntary Is patient capable of signing voluntary admission?: Yes Referral Source: Other(EMS) Insurance type: (Medicaid)  Medical Screening Exam [facility] Walk-in ONLY) Medical Exam completed: Yes  Crisis Care Plan Living Arrangements: Other relatives Legal Guardian: Other:(Self) Name of Psychiatrist: [facility]  Education Status Is patient currently in school?: No  Risk to self with the past 6 months Suicidal Ideation: No Has patient been a risk to self within the past 6 months prior to admission? : No Suicidal Intent: No Has patient had any suicidal intent within the past 6 months prior to admission? : No Is patient at risk for suicide?: No Suicidal Plan?: No Has patient had any  suicidal plan within the past 6 months prior to admission? : No Access to Means: No What has been your use of drugs/alcohol within the last 12 months?: (Patient refused to answer; admitted for drug overdose) Previous Attempts/Gestures: No Intentional Self Injurious Behavior:  None Family Suicide History: Unable to assess Recent stressful life event(s): (none reported) Persecutory voices/beliefs?: No Depression: No Depression Symptoms: Feeling angry/irritable Substance abuse history and/or treatment for substance abuse?: (Pt. reports drug testing in MH tx. ) Suicide prevention information given to non-admitted patients: Not applicable  Risk to Others within the past 6 months Homicidal Ideation: No Does patient have any lifetime risk of violence toward others beyond the six months prior to admission? : No Thoughts of Harm to Others: No Current Homicidal Intent: No Current Homicidal Plan: No Access to Homicidal Means: No History of harm to others?: No Assessment of Violence: None Noted Does patient have access to weapons?: No Criminal Charges Pending?: No Does patient have a court date: No Is patient on probation?: Unknown  Psychosis Hallucinations: None noted Delusions: None noted  Mental Status Report Appearance/Hygiene: Body odor, In scrubs Eye Contact: Poor Motor Activity: Freedom of movement, Other (Comment)(lethargic) Speech: Argumentative Level of Consciousness: Irritable Mood: Irritable Affect: Irritable, Apathetic, Apprehensive Anxiety Level: Minimal Thought Processes: Irrelevant, Coherent Judgement: Partial Orientation: Person, Place, Time, Situation, Appropriate for developmental age Obsessive Compulsive Thoughts/Behaviors: None  Cognitive Functioning Concentration: Fair Memory: Recent Intact, Remote Intact Is patient IDD: No Insight: Poor Impulse Control: Fair Appetite: (refused to answer) Have you had any weight changes? : (refused) Sleep: Unable to Assess Total Hours of Sleep: (refused)  ADLScreening [facility] Assessment Services) Patient's cognitive ability adequate to safely complete daily activities?: Yes Patient able to express need for assistance with ADLs?: Yes Independently performs ADLs?: Yes (appropriate for  developmental age)  Prior Inpatient Therapy Prior Inpatient Therapy: (pt refused to answer)  Prior Outpatient Therapy Prior Outpatient Therapy: Yes Prior Therapy Dates: current Prior Therapy Facilty/Provider(s): [facility] Reason for Treatment: Mental Health Disorder Does patient have an ACCT team?: Unknown Does patient have Intensive In-House Services?  : Unknown Does patient have [facility] services? : No Does patient have P4CC services?: No  ADL Screening (condition at time of admission) Patient's cognitive ability adequate to safely complete daily activities?: Yes Is the patient deaf or have difficulty hearing?: No Does the patient have difficulty seeing, even when wearing glasses/contacts?: No Does the patient have difficulty concentrating, remembering, or making decisions?: No Patient able to express need for assistance with ADLs?: Yes Does the patient have difficulty dressing or bathing?: No Independently performs ADLs?: Yes (appropriate for developmental age) Does the patient have difficulty walking or climbing stairs?: No Weakness of Legs: None Weakness of Arms/Hands: None  Home Assistive Devices/Equipment Home Assistive Devices/Equipment: None  Therapy Consults (therapy consults require a physician order) PT Evaluation Needed: No OT Evalulation Needed: No SLP Evaluation Needed: No Abuse/Neglect Assessment (Assessment to be complete while patient is alone) Abuse/Neglect Assessment Can Be Completed: Unable to assess, patient is non-responsive or altered mental status(patient was unwilling to respond to questions) Values / Beliefs Cultural Requests During Hospitalization: None Spiritual Requests During Hospitalization: None Consults Spiritual Care Consult Needed: No Social Work Consult Needed: No Merchant navy officer (For Healthcare) Does Patient Have a Medical Advance Directive?: No Would patient like information on creating a medical advance directive?: No -  Patient declined         Disposition:  Disposition Initial Assessment Completed for this Encounter: Yes Patient referred to: Outpatient  clinic referral(patient provided with three referral sources)  On Site Evaluation by:   Reviewed with Physician:  [facility]/Emergency Medicine  Starla LinkLatasha Y. Kathalene FramesHicks Becton, PhD, [facility], LCAS 05/22/2018 12:20 PM

## 2018-05-22 NOTE — ED Notes (Signed)
Pt escorted to lobby by [employer].

## 2018-05-22 NOTE — ED Notes (Signed)
To room to discharge pt, IV removed and discharge instructions provided.  Pt now stating she has no way home and needs her xanax before leaving.  Dr. Alphonzo Lemmings to room to discuss same with pt, no medications ordered at this time.  Pt informed she could wait in the lobby and use the phone in the lobby to continue to find a ride.  Pt wanting to stay in the room for "5 more minutes and use this phone."  Pt instructed that RN would be back in 5 more minutes and she would be escorted to lobby at that time.

## 2018-05-22 NOTE — ED Provider Notes (Addendum)
-----------------------------------------   8:54 AM on 05/22/2018 -----------------------------------------  In no acute distress awake and alert, continues to deny SI or HI, was trying to "get high".  Extensively counseled her about this, referring her to outpatient care.  ----------------------------------------- 7:18 AM on 05/22/2018 -----------------------------------------  Per signout, accidental heroin overdose no SI no HI cleared to go as soon as she is awake   Jeanmarie Plant, MD 05/22/18 3159    Jeanmarie Plant, MD 05/22/18 [phone]

## 2018-05-22 NOTE — ED Notes (Addendum)
Back to room to discharge pt who now states that she has not shoes and has to walk home.  Pt provided hospital socks.  Pt then asks why she was not allowed to bring anything with her when she came in by EMS.  Pt informed that this RN was not present at that time and could not answer that question.  PT states "you work for the hospital, call the EMS and ask them."  Pt then asks RN if she can get more food.  Pt provided with crackers and peanut butter as well as cola and ice.  Pt then states that she feels like this RN is pushing her out and that she did not ask about discharge to be given attitude.  Pt continues to state that she is not yet discharged and that she is "gathering herself."  This RN spoke with charge RN concerning same and officer called to escort pt to lobby.

## 2018-07-20 ENCOUNTER — Encounter: Payer: Self-pay | Admitting: Emergency Medicine

## 2018-07-20 ENCOUNTER — Emergency Department
Admission: EM | Admit: 2018-07-20 | Discharge: 2018-07-20 | Disposition: A | Discharge status: Home / Self Care | Payer: Medicaid Other | Attending: Emergency Medicine | Admitting: Emergency Medicine

## 2018-07-20 ENCOUNTER — Other Ambulatory Visit: Payer: Self-pay

## 2018-07-20 DIAGNOSIS — Z79899 Other long term (current) drug therapy: Secondary | ICD-10-CM | POA: Diagnosis not present

## 2018-07-20 DIAGNOSIS — K429 Umbilical hernia without obstruction or gangrene: Secondary | ICD-10-CM | POA: Diagnosis not present

## 2018-07-20 DIAGNOSIS — F17200 Nicotine dependence, unspecified, uncomplicated: Secondary | ICD-10-CM | POA: Diagnosis not present

## 2018-07-20 LAB — POC URINE PREG, ED: Preg Test, Ur: NEGATIVE

## 2018-07-20 MED ORDER — NAPROXEN 500 MG PO TABS: 500.0000 mg | ORAL_TABLET | Freq: Two times a day (BID) | ORAL | 2 refills | Status: DC

## 2018-07-20 NOTE — ED Provider Notes (Signed)
[facility] Emergency Department Provider Note   ____________________________________________    I have reviewed the triage vital signs and the nursing notes.   HISTORY  Chief Complaint Hernia     HPI Wanda Zavala is a 33 y.o. female who presents with concerns about possible hernia.  Patient describes bulging to the left of her umbilicus.  She reports she has had this for over a month however she talk to family members today and they said that she needed to have it surgically repaired immediately.  She denies fevers or chills.  No nausea or vomiting she reports a dull ache typically from the hernia, sometimes it bulges more when she is having a bowel movement.  Normal bowel movements.  Passing flatus.  Has not anything for this  Past Medical History:  Diagnosis Date  . ADHD   . Depression   . Seizures (HCC)     There are no active problems to display for this patient.   History reviewed. No pertinent surgical history.  Prior to Admission medications   Medication Sig Start Date End Date Taking? Authorizing Provider  alprazolam Prudy Feeler) 2 MG tablet Take 0.5 tablets (1 mg total) by mouth 2 (two) times daily. 06/24/16   Jeanmarie Plant, MD  doxepin (SINEQUAN) 50 MG capsule Take 50 mg by mouth at bedtime.    [provider]  ibuprofen (ADVIL,MOTRIN) 200 MG tablet Take 200 mg by mouth every 6 (six) hours as needed.    [provider]  naproxen (NAPROSYN) 500 MG tablet Take 1 tablet (500 mg total) by mouth 2 (two) times daily with a meal. 07/20/18   Jene Every, MD     Allergies Patient has no known allergies.  History reviewed. No pertinent family history.  Social History Social History   Tobacco Use  . Smoking status: Current Every Day Smoker  . Smokeless tobacco: Never Used  Substance Use Topics  . Alcohol use: Yes  . Drug use: No    Review of Systems  Constitutional: No fever/chills Eyes: No visual changes.    ENT: No sore throat. Cardiovascular: Denies chest pain. Respiratory: Denies shortness of breath. Gastrointestinal: As above Genitourinary: Negative for dysuria. Musculoskeletal: Negative for back pain. Skin: Negative for rash. Neurological: Negative for headaches    ____________________________________________   PHYSICAL EXAM:  VITAL SIGNS: ED Triage Vitals  Enc Vitals Group     BP 07/20/18 1350 (!) 140/104     Pulse Rate 07/20/18 1350 (!) 114     Resp 07/20/18 1350 18     Temp 07/20/18 1349 98.6 F (37 C)     Temp Source 07/20/18 1349 Oral     SpO2 07/20/18 1350 100 %     Weight 07/20/18 1351 59 kg (130 lb)     Height 07/20/18 1351 1.6 m (5\' 3" )     Head Circumference --      Peak Flow --      Pain Score 07/20/18 1350 10     Pain Loc --      Pain Edu? --      Excl. in GC? --     Constitutional: Alert and oriented.  Eyes: Conjunctivae are normal.   Nose: No congestion/rhinnorhea. Mouth/Throat: Mucous membranes are moist.    Cardiovascular: Normal rate, regular rhythm. Grossly normal heart sounds.  Good peripheral circulation. Respiratory: Normal respiratory effort.  No retractions. Lungs CTAB. Gastrointestinal: Soft, nontender, no distention.  Small left periumbilical hernia, easily reducible, no tenderness to  palpation or redness Genitourinary: deferred Musculoskeletal:   Warm and well perfused Neurologic:  Normal speech and language. No gross focal neurologic deficits are appreciated.  Skin:  Skin is warm, dry and intact. No rash noted. Psychiatric: Mood and affect are normal. Speech and behavior are normal.  ____________________________________________   LABS (all labs ordered are listed, but only abnormal results are displayed)  Labs Reviewed  POC URINE PREG, ED    ____________________________________________  EKG  None ____________________________________________  RADIOLOGY  None ____________________________________________   PROCEDURES  Procedure(s) performed: No  Procedures   Critical Care performed: No ____________________________________________   INITIAL IMPRESSION / ASSESSMENT AND PLAN / ED COURSE  Pertinent labs & imaging results that were available during my care of the patient were reviewed by me and considered in my medical decision making (see chart for details).  Patient well-appearing in no acute distress, reassuring exam.  Consistent with easily reducible umbilical hernia.  She has asked for [place] to repair this today but I informed her that she will need to follow-up as an outpatient.  I will prescribe naproxen and have referred her to general surgery.    ____________________________________________   FINAL CLINICAL IMPRESSION(S) / ED DIAGNOSES  Final diagnoses:  Umbilical hernia without obstruction and without gangrene        Note:  This document was prepared using Dragon voice recognition software and may include unintentional dictation errors.   Jene Every, MD 07/20/18 1436

## 2018-07-20 NOTE — ED Triage Notes (Signed)
Pt reports she has a umbilical hernia.  It gets bigger after she eats and then after she has a bowel movement it goes away per pt but comes back after eats.  Pt c/o pain to this area until it goes away after bowel movement; pain during bowel movent.  Pt has not been diagnosed in past but reports this is what she thinks it is.  It swells around umbilical area.  Pt denies period since October last year but denies pregnancy r/t tubes tied.

## 2018-07-20 NOTE — ED Notes (Signed)
Pt verbalizes d.c understanding , follow up, and RX given. PT in NAD, pt ambualtory, Pt unable to sign due to signature pad malfnx

## 2020-01-23 ENCOUNTER — Encounter (HOSPITAL_COMMUNITY): Payer: Self-pay

## 2020-01-23 ENCOUNTER — Emergency Department (HOSPITAL_COMMUNITY)
Admission: EM | Admit: 2020-01-23 | Discharge: 2020-01-24 | Disposition: A | Discharge status: Home / Self Care | Payer: Medicaid Other | Attending: Emergency Medicine | Admitting: Emergency Medicine

## 2020-01-23 ENCOUNTER — Other Ambulatory Visit: Payer: Self-pay

## 2020-01-23 ENCOUNTER — Emergency Department (HOSPITAL_COMMUNITY): Payer: Medicaid Other

## 2020-01-23 DIAGNOSIS — R1031 Right lower quadrant pain: Secondary | ICD-10-CM | POA: Diagnosis not present

## 2020-01-23 DIAGNOSIS — R000 Tachycardia, unspecified: Secondary | ICD-10-CM | POA: Diagnosis not present

## 2020-01-23 DIAGNOSIS — F17210 Nicotine dependence, cigarettes, uncomplicated: Secondary | ICD-10-CM | POA: Diagnosis not present

## 2020-01-23 DIAGNOSIS — K439 Ventral hernia without obstruction or gangrene: Secondary | ICD-10-CM

## 2020-01-23 DIAGNOSIS — Z20822 Contact with and (suspected) exposure to covid-19: Secondary | ICD-10-CM | POA: Insufficient documentation

## 2020-01-23 DIAGNOSIS — R1011 Right upper quadrant pain: Secondary | ICD-10-CM | POA: Insufficient documentation

## 2020-01-23 DIAGNOSIS — R109 Unspecified abdominal pain: Secondary | ICD-10-CM

## 2020-01-23 LAB — COMPREHENSIVE METABOLIC PANEL
ALT: 36 U/L (ref 0–44)
AST: 45 U/L — ABNORMAL HIGH (ref 15–41)
Albumin: 3.6 g/dL (ref 3.5–5.0)
Alkaline Phosphatase: 72 U/L (ref 38–126)
Anion gap: 11 (ref 5–15)
BUN: 12 mg/dL (ref 6–20)
CO2: 25 mmol/L (ref 22–32)
Calcium: 8.6 mg/dL — ABNORMAL LOW (ref 8.9–10.3)
Chloride: 99 mmol/L (ref 98–111)
Creatinine, Ser: 1.02 mg/dL — ABNORMAL HIGH (ref 0.44–1.00)
GFR calc Af Amer: >60 mL/min (ref >60)
GFR calc non Af Amer: >60 mL/min (ref >60)
Glucose, Bld: 103 mg/dL — ABNORMAL HIGH (ref 70–99)
Potassium: 3.3 mmol/L — ABNORMAL LOW (ref 3.5–5.1)
Sodium: 135 mmol/L (ref 135–145)
Total Bilirubin: 1 mg/dL (ref 0.3–1.2)
Total Protein: 7.9 g/dL (ref 6.5–8.1)

## 2020-01-23 LAB — CBC
HCT: 46.8 % — ABNORMAL HIGH (ref 36.0–46.0)
Hemoglobin: 15.3 g/dL — ABNORMAL HIGH (ref 12.0–15.0)
MCH: 34.5 pg — ABNORMAL HIGH (ref 26.0–34.0)
MCHC: 32.7 g/dL (ref 30.0–36.0)
MCV: 105.6 fL — ABNORMAL HIGH (ref 80.0–100.0)
Platelets: 391 10*3/uL (ref 150–400)
RBC: 4.43 MIL/uL (ref 3.87–5.11)
RDW: 13 % (ref 11.5–15.5)
WBC: 11.1 10*3/uL — ABNORMAL HIGH (ref 4.0–10.5)
nRBC: 0 % (ref 0.0–0.2)

## 2020-01-23 LAB — URINALYSIS, ROUTINE W REFLEX MICROSCOPIC
Bilirubin Urine: NEGATIVE
Glucose, UA: NEGATIVE mg/dL
Ketones, ur: NEGATIVE mg/dL
Nitrite: NEGATIVE
Protein, ur: 30 mg/dL — AB
Specific Gravity, Urine: 1.017 (ref 1.005–1.030)
pH: 6 (ref 5.0–8.0)

## 2020-01-23 LAB — SARS CORONAVIRUS 2 BY RT PCR (HOSPITAL ORDER, PERFORMED IN ~~LOC~~ HOSPITAL LAB): SARS Coronavirus 2: NEGATIVE

## 2020-01-23 LAB — LIPASE, BLOOD: Lipase: 29 U/L (ref 11–51)

## 2020-01-23 LAB — PREGNANCY, URINE: Preg Test, Ur: NEGATIVE

## 2020-01-23 MED ORDER — SODIUM CHLORIDE 0.9 % IV BOLUS
1000.0000 mL | Freq: Once | INTRAVENOUS | Status: AC
  Administered 2020-01-23: 1000 mL via INTRAVENOUS

## 2020-01-23 MED ORDER — HYDROCODONE-ACETAMINOPHEN 5-325 MG PO TABS: 1.0000 | ORAL_TABLET | ORAL | 0 refills | Status: DC | PRN

## 2020-01-23 MED ORDER — ONDANSETRON HCL 4 MG/2ML IJ SOLN
4.0000 mg | Freq: Once | INTRAMUSCULAR | Status: AC
  Administered 2020-01-23: 4 mg via INTRAVENOUS
  Filled 2020-01-23: qty 2

## 2020-01-23 MED ORDER — MORPHINE SULFATE (PF) 4 MG/ML IV SOLN
4.0000 mg | Freq: Once | INTRAVENOUS | Status: AC
  Administered 2020-01-23: 4 mg via INTRAVENOUS
  Filled 2020-01-23: qty 1

## 2020-01-23 MED ORDER — IOHEXOL 300 MG/ML  SOLN
75.0000 mL | Freq: Once | INTRAMUSCULAR | Status: AC | PRN
  Administered 2020-01-23: 75 mL via INTRAVENOUS

## 2020-01-23 MED ORDER — PROMETHAZINE HCL 25 MG PO TABS: 25.0000 mg | ORAL_TABLET | Freq: Four times a day (QID) | ORAL | 0 refills | Status: AC | PRN

## 2020-01-23 NOTE — ED Notes (Signed)
Patient weighs 180lbs

## 2020-01-23 NOTE — ED Triage Notes (Signed)
Pt presents to ED with complaints of right lower abdominal pain since 1300. Pt denies N/V/D

## 2020-01-23 NOTE — ED Provider Notes (Addendum)
Pt signed out from [employer] pending Ct abd for RUQ pain which started while napping this afternoon.  Labs reviewed, no urinary sx, culture pending.  CT with infraumbilical fat containing hernia, no incarceration.  She is tender at this site but no mass appreciated, more tender RUQ without guarding or other acute abd findings. LFT's, bilirubin and lipase normal, Ct imaging with normal gallbladder findings.  She was prescribed phenergan and hydrocodone for pain and nausea, strict return precautions outlined including worse pain, fever, vomiting.  Referral to general surgery for outpatient f/u.  Also given referral to [facility] for pcp as no current insurance, has just moved to [place], so no current pcp either.    Results for orders placed or performed during the hospital encounter of 01/23/20  SARS Coronavirus 2 by RT PCR (hospital order, performed in [facility] hospital lab) Nasopharyngeal Nasopharyngeal Swab   Specimen: Nasopharyngeal Swab  Result Value Ref Range   SARS Coronavirus 2 NEGATIVE NEGATIVE  Lipase, blood  Result Value Ref Range   Lipase 29 11 - 51 U/L  Comprehensive metabolic panel  Result Value Ref Range   Sodium 135 135 - 145 mmol/L   Potassium 3.3 (L) 3.5 - 5.1 mmol/L   Chloride 99 98 - 111 mmol/L   CO2 25 22 - 32 mmol/L   Glucose, Bld 103 (H) 70 - 99 mg/dL   BUN 12 6 - 20 mg/dL   Creatinine, Ser 8.18 (H) 0.44 - 1.00 mg/dL   Calcium 8.6 (L) 8.9 - 10.3 mg/dL   Total Protein 7.9 6.5 - 8.1 g/dL   Albumin 3.6 3.5 - 5.0 g/dL   AST 45 (H) 15 - 41 U/L   ALT 36 0 - 44 U/L   Alkaline Phosphatase 72 38 - 126 U/L   Total Bilirubin 1.0 0.3 - 1.2 mg/dL   GFR calc non Af Amer >60 >60 mL/min   GFR calc Af Amer >60 >60 mL/min   Anion gap 11 5 - 15  CBC  Result Value Ref Range   WBC 11.1 (H) 4.0 - 10.5 K/uL   RBC 4.43 3.87 - 5.11 MIL/uL   Hemoglobin 15.3 (H) 12.0 - 15.0 g/dL   HCT 56.3 (H) 36 - 46 %   MCV 105.6 (H) 80.0 - 100.0 fL   MCH 34.5 (H) 26.0 - 34.0 pg    MCHC 32.7 30.0 - 36.0 g/dL   RDW 14.9 70.2 - 63.7 %   Platelets 391 150 - 400 K/uL   nRBC 0.0 0.0 - 0.2 %  Urinalysis, Routine w reflex microscopic Urine, Clean Catch  Result Value Ref Range   Color, Urine YELLOW YELLOW   APPearance HAZY (A) CLEAR   Specific Gravity, Urine 1.017 1.005 - 1.030   pH 6.0 5.0 - 8.0   Glucose, UA NEGATIVE NEGATIVE mg/dL   Hgb urine dipstick LARGE (A) NEGATIVE   Bilirubin Urine NEGATIVE NEGATIVE   Ketones, ur NEGATIVE NEGATIVE mg/dL   Protein, ur 30 (A) NEGATIVE mg/dL   Nitrite NEGATIVE NEGATIVE   Leukocytes,Ua TRACE (A) NEGATIVE   RBC / HPF 21-50 0 - 5 RBC/hpf   WBC, UA 6-10 0 - 5 WBC/hpf   Bacteria, UA FEW (A) NONE SEEN   Squamous Epithelial / LPF 11-20 0 - 5   Mucus PRESENT    Hyaline Casts, UA PRESENT   Pregnancy, urine  Result Value Ref Range   Preg Test, Ur NEGATIVE NEGATIVE   CT ABDOMEN PELVIS W CONTRAST  Result Date: 01/23/2020  CLINICAL DATA:  Right lower quadrant pain EXAM: CT ABDOMEN AND PELVIS WITH CONTRAST TECHNIQUE: Multidetector CT imaging of the abdomen and pelvis was performed using the standard protocol following bolus administration of intravenous contrast. CONTRAST:  42mL OMNIPAQUE IOHEXOL 300 MG/ML  SOLN COMPARISON:  None. FINDINGS: Lower chest: No acute abnormality. Hepatobiliary: No focal liver abnormality is seen. No gallstones, gallbladder wall thickening, or biliary dilatation. Pancreas: Unremarkable. No pancreatic ductal dilatation or surrounding inflammatory changes. Spleen: Normal in size without focal abnormality. Adrenals/Urinary Tract: Adrenal glands are unremarkable. Kidneys are normal, without renal calculi, focal lesion, or hydronephrosis. Bladder is unremarkable. Stomach/Bowel: Stomach is within normal limits. Appendix appears normal. No evidence of bowel wall thickening, distention, or inflammatory changes. Vascular/Lymphatic: No significant vascular findings are present. No enlarged abdominal or pelvic lymph nodes.  Reproductive: Uterus and bilateral adnexa are unremarkable. Other: Negative for free air or free fluid. Moderate fat containing infraumbilical ventral hernia. Musculoskeletal: No acute or significant osseous findings. IMPRESSION: 1. No CT evidence for acute intra-abdominal or pelvic abnormality. 2. Moderate fat containing infraumbilical ventral hernia. Electronically Signed   By: Jasmine Pang M.D.   On: 01/23/2020 22:06      Victoriano Lain 01/24/20 0020    Burgess Amor, PA-C 01/24/20 Kathlen Mody, MD 01/25/20 [phone]

## 2020-01-23 NOTE — Discharge Instructions (Addendum)
You do have a hernia as discussed with a small bit of fat within it which could be the source of your pain, but no evidence for emergent surgery for this condition.   You may use the medicines prescribed for pain if needed. Hydrocodone will make you drowsy - do not drive within 4 hours of taking this medicine. Return for a recheck for any worsening pain or development of fever or vomiting.

## 2020-01-23 NOTE — ED Provider Notes (Signed)
[facility] EMERGENCY DEPARTMENT Provider Note   CSN: [id number] Arrival date & time: 01/23/20  1614     History Chief Complaint  Patient presents with  . Abdominal Pain    Wanda Zavala is a 34 y.o. female with past medical history significant for ADHD, depression, seizures. Abdominal surgical history includes trichobezoar removal at age 16.  HPI Patient presents to emergency department today with chief complaint of abdominal pain.  Onset was acute starting 4 hours prior to arrival.  Patient states that she was laying down to take a nap she had pain in her right upper and lower abdomen.  Pain does not radiate to her back or groin.  She states she cannot get comfortable in the bed as of the pain.  She rates the pain 8 out of 10 in severity.  She did not take any medications for symptoms prior to arrival.  Patient states she did not eat anything yet today.  Denies any fever, chills, shortness of breath, chest pain, nausea, emesis, dysuria, gross hematuria, urinary frequency pelvic pain, abnormal vaginal bleeding, vaginal discharge, diarrhea.  Last bowel movement was yesterday and was normal.  Denies any drug use.  Only occasionally drinks alcohol, none recently. Not concerned for STIs.       Past Medical History:  Diagnosis Date  . ADHD   . Depression   . Seizures (HCC)     There are no problems to display for this patient.   History reviewed. No pertinent surgical history.   OB History   No obstetric history on file.     No family history on file.  Social History   Tobacco Use  . Smoking status: Current Every Day Smoker    Packs/day: 1.00    Types: Cigarettes  . Smokeless tobacco: Never Used  Substance Use Topics  . Alcohol use: Yes  . Drug use: No    Home Medications Prior to Admission medications   Not on File    Allergies    Patient has no known allergies.  Review of Systems   Review of Systems All other systems are reviewed and are negative for  acute change except as noted in the HPI.  Physical Exam Updated Vital Signs BP (!) 148/112 (BP Location: Right Arm)   Pulse (!) 107   Temp 99.3 F (37.4 C) (Oral)   Resp 20   Ht 5\' 3"  (1.6 m)   LMP 12/25/2019   SpO2 99%   BMI 23.03 kg/m   Physical Exam Vitals and nursing note reviewed.  Constitutional:      General: She is not in acute distress.    Appearance: She is not ill-appearing.  HENT:     Head: Normocephalic and atraumatic.     Right Ear: Tympanic membrane and external ear normal.     Left Ear: Tympanic membrane and external ear normal.     Nose: Nose normal.     Mouth/Throat:     Mouth: Mucous membranes are dry.     Pharynx: Oropharynx is clear.  Eyes:     General: No scleral icterus.       Right eye: No discharge.        Left eye: No discharge.     Extraocular Movements: Extraocular movements intact.     Conjunctiva/sclera: Conjunctivae normal.     Pupils: Pupils are equal, round, and reactive to light.  Neck:     Vascular: No JVD.  Cardiovascular:     Rate and Rhythm: Regular rhythm.  Tachycardia present.     Pulses: Normal pulses.          Radial pulses are 2+ on the right side and 2+ on the left side.     Heart sounds: Normal heart sounds.  Pulmonary:     Comments: Lungs clear to auscultation in all fields. Symmetric chest rise. No wheezing, rales, or rhonchi. Abdominal:     Tenderness: There is no right CVA tenderness or left CVA tenderness.     Comments: Abdomen is soft, non-distended. Tender to palpation of RUQ. No rigidity, no guarding. No peritoneal signs.  Musculoskeletal:        General: Normal range of motion.     Cervical back: Normal range of motion.  Skin:    General: Skin is warm and dry.     Capillary Refill: Capillary refill takes less than 2 seconds.  Neurological:     Mental Status: She is oriented to person, place, and time.     GCS: GCS eye subscore is 4. GCS verbal subscore is 5. GCS motor subscore is 6.     Comments: Fluent  speech, no facial droop.  Psychiatric:        Behavior: Behavior normal.     ED Results / Procedures / Treatments   Labs (all labs ordered are listed, but only abnormal results are displayed) Labs Reviewed  COMPREHENSIVE METABOLIC PANEL - Abnormal; Notable for the following components:      Result Value   Potassium 3.3 (*)    Glucose, Bld 103 (*)    Creatinine, Ser 1.02 (*)    Calcium 8.6 (*)    AST 45 (*)    All other components within normal limits  CBC - Abnormal; Notable for the following components:   WBC 11.1 (*)    Hemoglobin 15.3 (*)    HCT 46.8 (*)    MCV 105.6 (*)    MCH 34.5 (*)    All other components within normal limits  URINALYSIS, ROUTINE W REFLEX MICROSCOPIC - Abnormal; Notable for the following components:   APPearance HAZY (*)    Hgb urine dipstick LARGE (*)    Protein, ur 30 (*)    Leukocytes,Ua TRACE (*)    Bacteria, UA FEW (*)    All other components within normal limits  SARS CORONAVIRUS 2 BY RT PCR (HOSPITAL ORDER, PERFORMED IN [facility])  LIPASE, BLOOD  POC URINE PREG, ED    EKG None  Radiology No results found.  Procedures Procedures (including critical care time)  Medications Ordered in ED Medications  sodium chloride 0.9 % bolus 1,000 mL (1,000 mLs Intravenous New Bag/Given 01/23/20 2055)  ondansetron (ZOFRAN) injection 4 mg (4 mg Intravenous Given 01/23/20 2056)  morphine 4 MG/ML injection 4 mg (4 mg Intravenous Given 01/23/20 2056)    ED Course  I have reviewed the triage vital signs and the nursing notes.  Pertinent labs & imaging results that were available during my care of the patient were reviewed by me and considered in my medical decision making (see chart for details).    MDM Rules/Calculators/A&P                          History provided by patient with additional history obtained from chart review.    Patient presents to the ED with complaints of abdominal pain. Patient nontoxic appearing, in no  apparent distress. Afebrile, normotensive, tachycardic in triage to 107. On exam patient tender to  RUQ, no peritoneal signs. No CVA tenderness. Mucus membranes are dry, looks dehydrated. Will evaluate with labs and CT A/P. Analgesics, anti-emetics, and fluids administered.   Labs collected in triage.  I viewed results which show mild leukocytosis of 11.1.  Hemoconcentrated likely secondary to dehydration with poor p.o. intake.  No significant electrolyte derangement, no significant renal insufficiency, no transaminitis.  Lipase is within normal range.  UA without obvious infection, does have trace leukocytes, 6-10 WBC however there are 11-20 squamous epithelial cells present suggesting likely contaminated sample.  She denied any urinary symptoms.  Patient care transferred to J. Idol PA-C at the end of my shift pending CT AP and PO challenge. Patient presentation, ED course, and plan of care discussed with review of all pertinent labs and imaging. Please see her note for further details regarding further ED course and disposition.   Final Clinical Impression(s) / ED Diagnoses Final diagnoses:  Abdominal pain, unspecified abdominal location    Rx / DC Orders ED Discharge Orders    None       Kathyrn Lass 01/23/20 2111    Raeford Razor, MD 01/25/20 [phone]

## 2020-01-24 MED ORDER — HYDROCODONE-ACETAMINOPHEN 5-325 MG PO TABS
1.0000 | ORAL_TABLET | Freq: Once | ORAL | Status: AC
  Administered 2020-01-24: 1 via ORAL
  Filled 2020-01-24: qty 1

## 2020-01-24 MED ORDER — HYDROCODONE-ACETAMINOPHEN 5-325 MG PO TABS: 1.0000 | ORAL_TABLET | ORAL | 0 refills | Status: AC | PRN

## 2020-03-10 ENCOUNTER — Ambulatory Visit: Payer: Medicaid Other

## 2020-03-26 ENCOUNTER — Ambulatory Visit: Payer: Medicaid Other | Attending: Internal Medicine

## 2020-03-26 DIAGNOSIS — Z23 Encounter for immunization: Secondary | ICD-10-CM

## 2020-03-26 NOTE — Progress Notes (Signed)
   Covid-19 Vaccination Clinic  Name:  Wanda Zavala    MRN: [id number] DOB: [birth year]  03/26/2020  Ms. Dress was observed post Covid-19 immunization for 15 minutes without incident. She was provided with Vaccine Information Sheet and instruction to access the V-Safe system.   [name] was instructed to call 911 with any severe reactions post vaccine: Marland Kitchen Difficulty breathing  . Swelling of face and throat  . A fast heartbeat  . A bad rash all over body  . Dizziness and weakness   Immunizations Administered    Name Date Dose VIS Date Route   Moderna COVID-19 Vaccine 03/26/2020  1:16 PM 0.5 mL 02/29/2020 Intramuscular   Manufacturer: Moderna   Lot: [id number]   NDC: 70488-891-69
